# Patient Record
Sex: Female | Born: 1995 | Race: White | Hispanic: No | Marital: Single | Smoking: Never smoker
Health system: Southern US, Community
[De-identification: ages and names within clinical notes are randomized; demographics above are authoritative.]

---
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
## 2018-11-08 IMAGING — CT CT RENAL STONE PROTOCOL
3 of 4 series · 8 of 46 positions shown, 15 images · non-contrast
Comparison: None.

CLINICAL DATA: Right flank pain for 5 days. Recent diagnosis of
urinary tract infection with antibiotics, no relief of symptoms.

EXAM:
CT ABDOMEN AND PELVIS WITHOUT CONTRAST
TECHNIQUE: Multidetector CT imaging of the abdomen and pelvis was performed
following the standard protocol without IV contrast.

[Series 4: lung bases · axial · 0.69mm/px · z∈[-122,-67]mm · 4 of 19 slices shown, 9 images]
[im 4/19  soft-tissue]
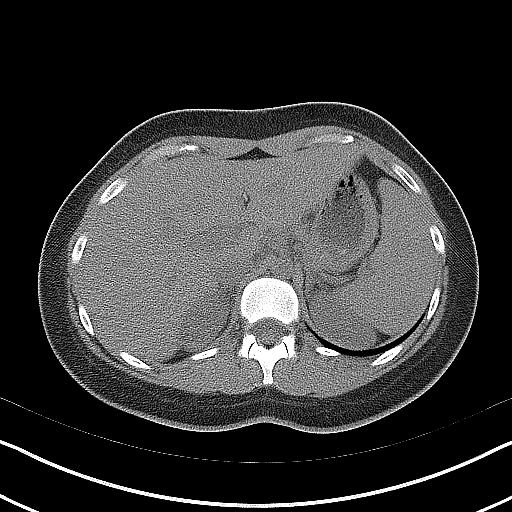
[im 4/19  lung]
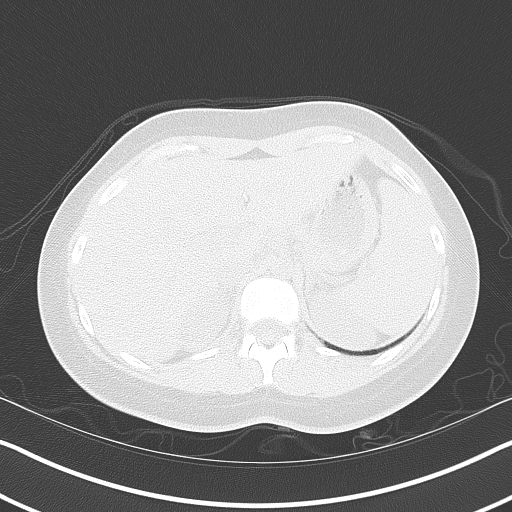
[im 4/19  bone]
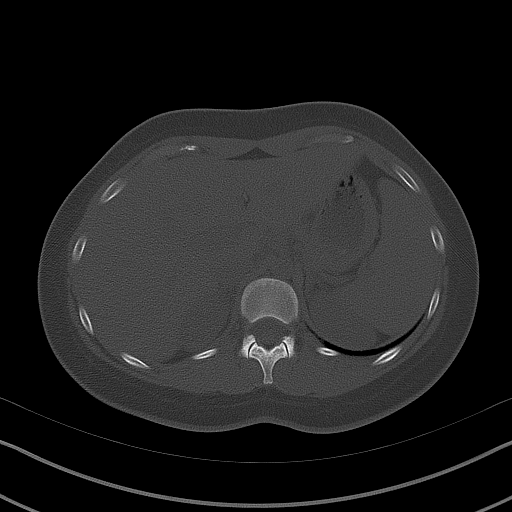
[im 8/19  soft-tissue]
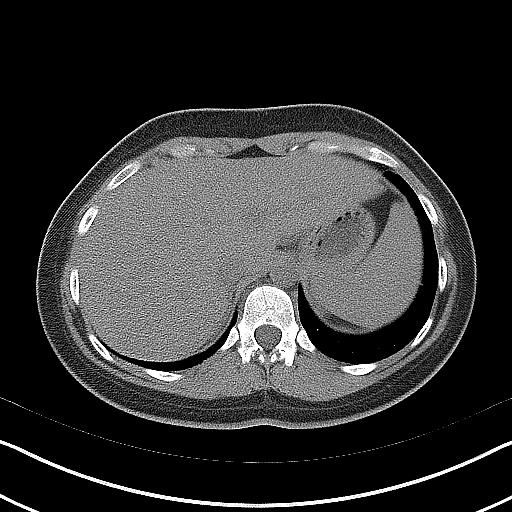
[im 8/19  lung]
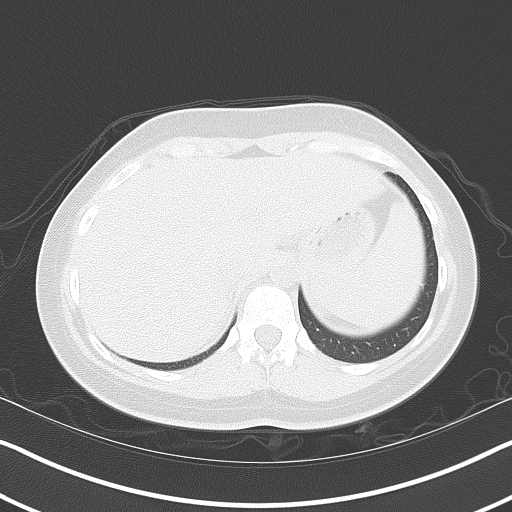
[im 11/19  soft-tissue]
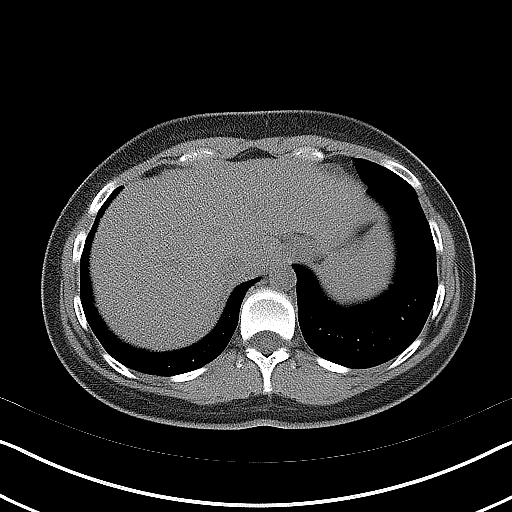
[im 11/19  lung]
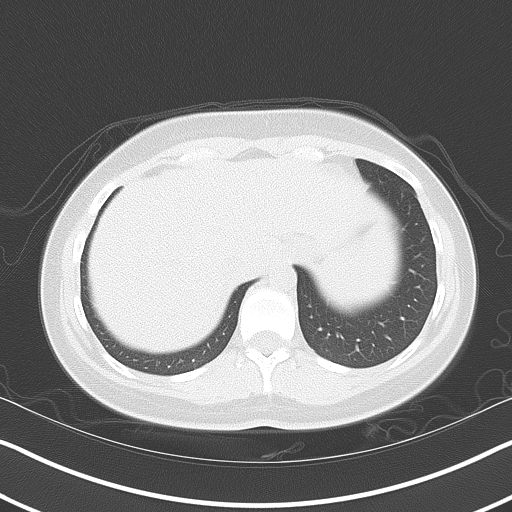
[im 15/19  soft-tissue]
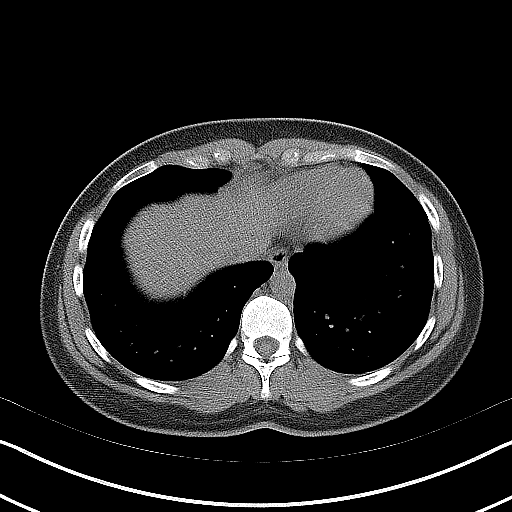
[im 15/19  lung]
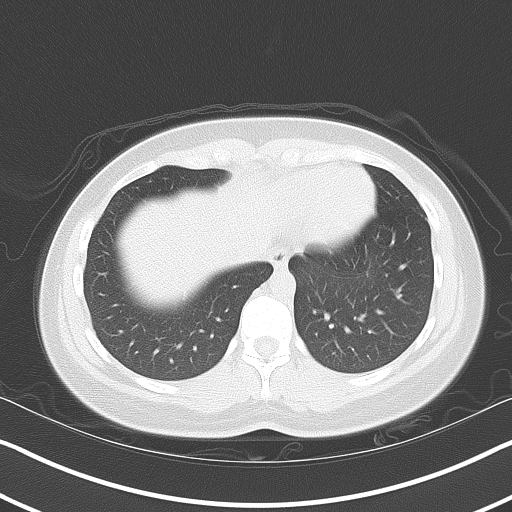

[Series 5: coronal · coronal · 0.72mm/px · 3 of 119 slices shown, 4 images]
[im 40/119  soft-tissue]
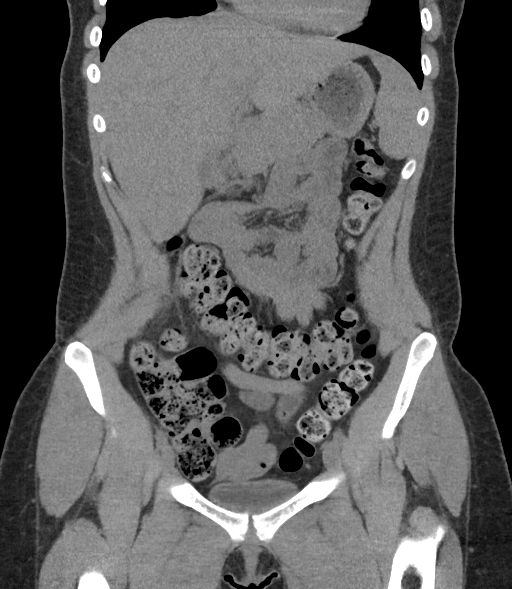
[im 53/119  soft-tissue]
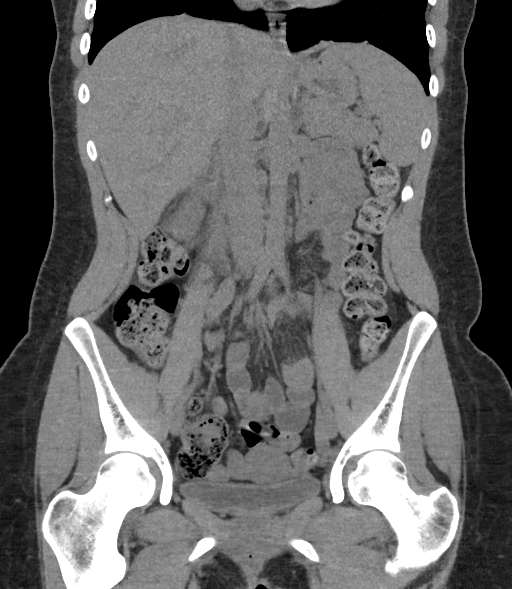
[im 53/119  bone]
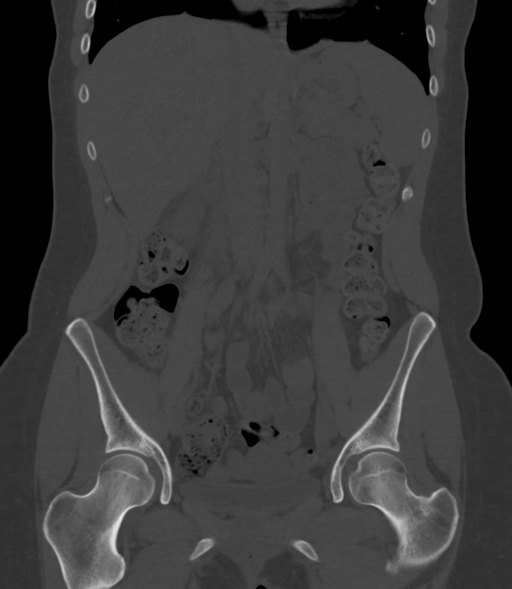
[im 66/119  soft-tissue]
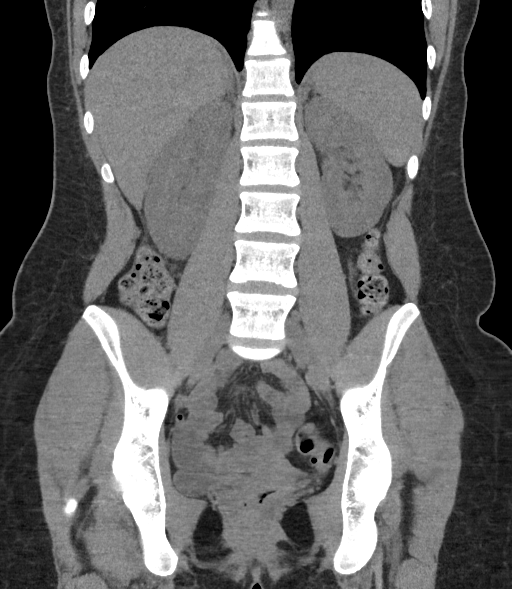

[Series 6: sagittal · sagittal · 0.43mm/px · 1 of 171 slices shown, 2 images]
[im 57/171  soft-tissue]
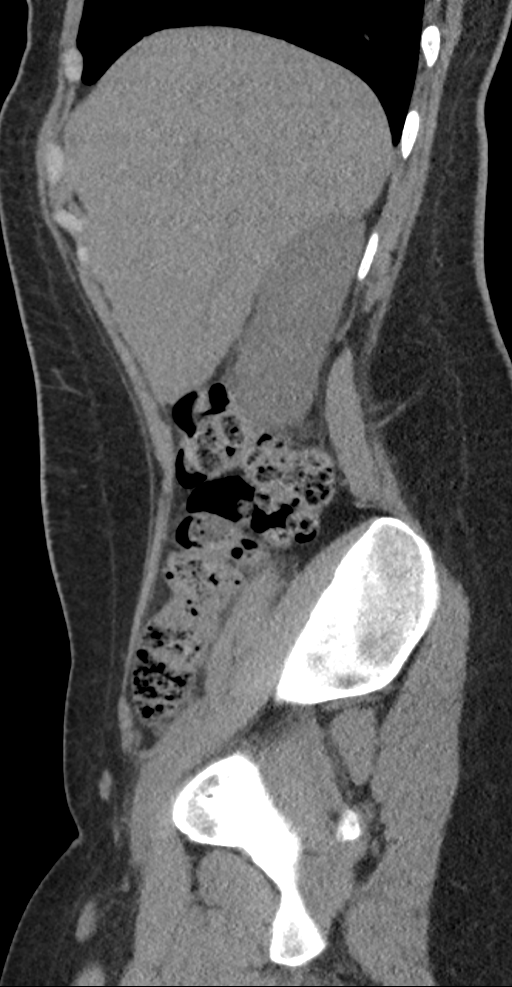
[im 57/171  bone]
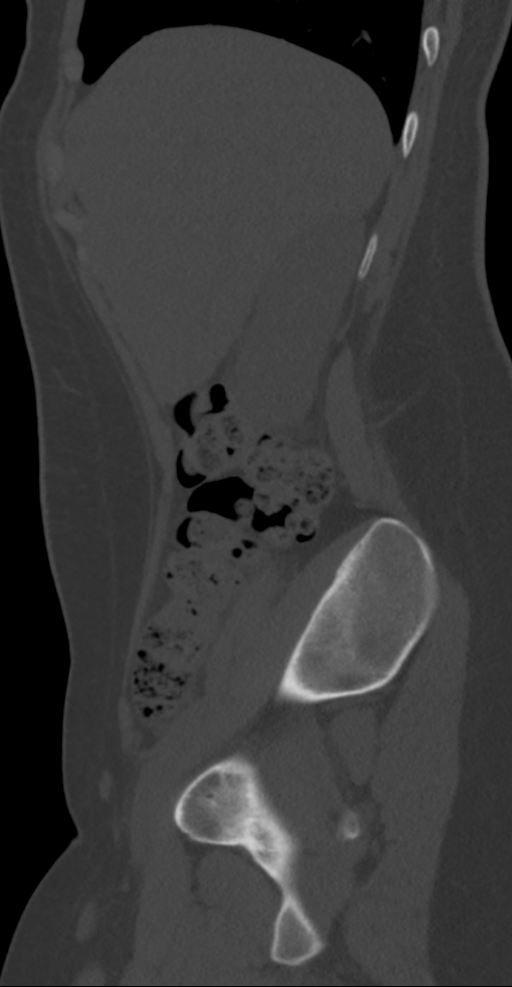

[8 of 46 positions shown; findings below may reference images not displayed]

FINDINGS: Lower chest: The lung bases are clear.

Hepatobiliary: No focal liver abnormality is seen. No gallstones,
gallbladder wall thickening, or biliary dilatation.

Pancreas: No ductal dilatation or inflammation.

Spleen: Normal in size without focal abnormality.

Adrenals/Urinary Tract: Edematous appearance of the right kidney
with perinephric edema and trace perinephric fluid. No frank
hydronephrosis. No urolithiasis. Ureter is nondistended without
stone along the course. No evidence of renal fluid collection on
noncontrast exam. Noncontrast appearance of the left kidney is
normal. Urinary bladder is minimally distended with equivocal
bladder wall thickening.

Stomach/Bowel: Stomach is within normal limits. Appendix appears
normal. No evidence of bowel wall thickening, distention, or
inflammatory changes.

Vascular/Lymphatic: No significant vascular findings are present. No
enlarged abdominal or pelvic lymph nodes.

Reproductive: There is a 2.6 cm right ovarian cyst, likely
physiologic in a patient of this age. This requires no dedicated
further imaging follow-up. Unenhanced uterus is unremarkable. The
left ovary is not visualized, no adnexal mass.

Other: Minimal free fluid in the pelvis.  No free air.

Musculoskeletal: There are no acute or suspicious osseous
abnormalities.
IMPRESSION: No urolithiasis. Edematous appearance of the right kidney with
perinephric edema suggesting pyelonephritis. Equivocal bladder wall
thickening.
